# Patient Record
Sex: Male | Born: 1990 | Race: White | Hispanic: No | Marital: Single | Smoking: Light tobacco smoker
Health system: Southern US, Community
[De-identification: ages and names within clinical notes are randomized; demographics above are authoritative.]

## PROBLEM LIST (undated history)

## (undated) DIAGNOSIS — K219 Gastro-esophageal reflux disease without esophagitis: Secondary | ICD-10-CM

## (undated) DIAGNOSIS — J45909 Unspecified asthma, uncomplicated: Secondary | ICD-10-CM

## (undated) HISTORY — PX: FOOT SURGERY: SHX648

---

## 2013-09-14 ENCOUNTER — Emergency Department (HOSPITAL_COMMUNITY): Payer: BC Managed Care – PPO

## 2013-09-14 ENCOUNTER — Emergency Department (HOSPITAL_COMMUNITY)
Admission: EM | Admit: 2013-09-14 | Discharge: 2013-09-14 | Disposition: A | Discharge status: Home / Self Care | Payer: BC Managed Care – PPO | Attending: Emergency Medicine | Admitting: Emergency Medicine

## 2013-09-14 ENCOUNTER — Encounter (HOSPITAL_COMMUNITY): Payer: Self-pay | Admitting: Emergency Medicine

## 2013-09-14 DIAGNOSIS — J45909 Unspecified asthma, uncomplicated: Secondary | ICD-10-CM | POA: Insufficient documentation

## 2013-09-14 DIAGNOSIS — Y9301 Activity, walking, marching and hiking: Secondary | ICD-10-CM | POA: Insufficient documentation

## 2013-09-14 DIAGNOSIS — R609 Edema, unspecified: Secondary | ICD-10-CM | POA: Insufficient documentation

## 2013-09-14 DIAGNOSIS — W010XXA Fall on same level from slipping, tripping and stumbling without subsequent striking against object, initial encounter: Secondary | ICD-10-CM | POA: Insufficient documentation

## 2013-09-14 DIAGNOSIS — Y929 Unspecified place or not applicable: Secondary | ICD-10-CM | POA: Insufficient documentation

## 2013-09-14 DIAGNOSIS — K0889 Other specified disorders of teeth and supporting structures: Secondary | ICD-10-CM

## 2013-09-14 DIAGNOSIS — S93409A Sprain of unspecified ligament of unspecified ankle, initial encounter: Secondary | ICD-10-CM | POA: Insufficient documentation

## 2013-09-14 DIAGNOSIS — F17200 Nicotine dependence, unspecified, uncomplicated: Secondary | ICD-10-CM | POA: Insufficient documentation

## 2013-09-14 DIAGNOSIS — K089 Disorder of teeth and supporting structures, unspecified: Secondary | ICD-10-CM | POA: Insufficient documentation

## 2013-09-14 DIAGNOSIS — S93401A Sprain of unspecified ligament of right ankle, initial encounter: Secondary | ICD-10-CM

## 2013-09-14 DIAGNOSIS — R062 Wheezing: Secondary | ICD-10-CM | POA: Insufficient documentation

## 2013-09-14 HISTORY — DX: Unspecified asthma, uncomplicated: J45.909

## 2013-09-14 HISTORY — DX: Gastro-esophageal reflux disease without esophagitis: K21.9

## 2013-09-14 MED ORDER — PENICILLIN V POTASSIUM 500 MG PO TABS: 500.0000 mg | ORAL_TABLET | Freq: Three times a day (TID) | ORAL | Status: AC

## 2013-09-14 MED ORDER — OXYCODONE-ACETAMINOPHEN 5-325 MG PO TABS
2.0000 | ORAL_TABLET | Freq: Once | ORAL | Status: AC
  Administered 2013-09-14: 2 via ORAL
  Filled 2013-09-14: qty 2

## 2013-09-14 MED ORDER — OXYCODONE-ACETAMINOPHEN 5-325 MG PO TABS: 1.0000 | ORAL_TABLET | Freq: Three times a day (TID) | ORAL | Status: DC | PRN

## 2013-09-14 MED ORDER — OXYCODONE-ACETAMINOPHEN 5-325 MG PO TABS: 1.0000 | ORAL_TABLET | Freq: Three times a day (TID) | ORAL | Status: AC | PRN

## 2013-09-14 MED ORDER — PENICILLIN V POTASSIUM 500 MG PO TABS: 500.0000 mg | ORAL_TABLET | Freq: Three times a day (TID) | ORAL | Status: DC

## 2013-09-14 MED ORDER — IBUPROFEN 800 MG PO TABS: 800.0000 mg | ORAL_TABLET | Freq: Three times a day (TID) | ORAL | Status: DC

## 2013-09-14 MED ORDER — IBUPROFEN 800 MG PO TABS: 800.0000 mg | ORAL_TABLET | Freq: Three times a day (TID) | ORAL | Status: AC

## 2013-09-14 NOTE — ED Notes (Addendum)
Patient tripped on dog toy 24 hours ago.  Now with swelling and pain in right ankle.  Patient also having dental pain right lower jaw.

## 2013-09-14 NOTE — ED Notes (Signed)
Ortho tech called for splint and crutches. 

## 2013-09-14 NOTE — Discharge Instructions (Signed)
Ankle Sprain An ankle sprain is an injury to the strong, fibrous tissues (ligaments) that hold your ankle bones together.  HOME CARE   Put ice on your ankle for 1 2 days or as told by your doctor.  Put ice in a plastic bag.  Place a towel between your skin and the bag.  Leave the ice on for 15-20 minutes at a time, every 2 hours while you are awake.  Only take medicine as told by your doctor.  Raise (elevate) your injured ankle above the level of your heart as much as possible for 2 3 days.  Use crutches if your doctor tells you to. Slowly put your own weight on the affected ankle. Use the crutches until you can walk without pain.  If you have a plaster splint:  Do not rest it on anything harder than a pillow for 24 hours.  Do not put weight on it.  Do not get it wet.  Take it off to shower or bathe.  If given, use an elastic wrap or support stocking for support. Take the wrap off if your toes lose feeling (numb), tingle, or turn cold or blue.  If you have an air splint:  Add or let out air to make it comfortable.  Take it off at night and to shower and bathe.  Wiggle your toes and move your ankle up and down often while you are wearing it. GET HELP RIGHT AWAY IF:   Your toes lose feeling (numb) or turn blue.  You have severe pain that is increasing.  You have rapidly increasing bruising or puffiness (swelling).  Your toes feel very cold.  You lose feeling in your foot.  Your medicine does not help your pain. MAKE SURE YOU:   Understand these instructions.  Will watch your condition.  Will get help right away if you are not doing well or get worse. Document Released: 12/04/2007 Document Revised: 03/11/2012 Document Reviewed: 12/30/2011 [facility] Patient Information 2014 [place], [place]. Dental Pain A tooth ache may be caused by cavities (tooth decay). Cavities expose the nerve of the tooth to air and hot or cold temperatures. It may come from an infection or  abscess (also called a boil or furuncle) around your tooth. It is also often caused by dental caries (tooth decay). This causes the pain you are having. DIAGNOSIS  Your caregiver can diagnose this problem by exam. TREATMENT   If caused by an infection, it may be treated with medications which kill germs (antibiotics) and pain medications as prescribed by your caregiver. Take medications as directed.  Only take over-the-counter or prescription medicines for pain, discomfort, or fever as directed by your caregiver.  Whether the tooth ache today is caused by infection or dental disease, you should see your dentist as soon as possible for further care. SEEK MEDICAL CARE IF: The exam and treatment you received today has been provided on an emergency basis only. This is not a substitute for complete medical or dental care. If your problem worsens or new problems (symptoms) appear, and you are unable to meet with your dentist, call or return to this location. SEEK IMMEDIATE MEDICAL CARE IF:   You have a fever.  You develop redness and swelling of your face, jaw, or neck.  You are unable to open your mouth.  You have severe pain uncontrolled by pain medicine. MAKE SURE YOU:   Understand these instructions.  Will watch your condition.  Will get help right away if you  are not doing well or get worse. Document Released: 06/17/2005 Document Revised: 09/09/2011 Document Reviewed: 02/03/2008 [facility] Patient Information 2014 [place], [place].

## 2013-09-14 NOTE — ED Provider Notes (Signed)
CSN: [id number]     Arrival date & time 09/14/13  0027 History   First MD Initiated Contact with Patient 09/14/13 0057     Chief Complaint  Patient presents with  . Ankle Pain     (Consider location/radiation/quality/duration/timing/severity/associated sxs/prior Treatment) Patient is a 23 y.o. male presenting with ankle pain. The history is provided by the patient. No language interpreter was used.  Ankle Pain Location:  Ankle Time since incident:  2 days Injury: yes   Mechanism of injury: fall   Fall:    Fall occurred:  Walking   Impact surface:  Hard floor   Entrapped after fall: no   Ankle location:  R ankle Pain details:    Quality:  Aching   Radiates to:  Does not radiate   Severity:  Moderate   Onset quality:  Sudden   Timing:  Constant   Progression:  Worsening Chronicity:  New Dislocation: no   Prior injury to area:  No   Past Medical History  Diagnosis Date  . Asthma   . Acid reflux    Past Surgical History  Procedure Laterality Date  . Foot surgery     History reviewed. No pertinent family history. History  Substance Use Topics  . Smoking status: Light Tobacco Smoker  . Smokeless tobacco: Not on file  . Alcohol Use: No    Review of Systems  HENT: Positive for dental problem.   Musculoskeletal: Positive for arthralgias.  All other systems reviewed and are negative.      Allergies  Vicodin  Home Medications   Current Outpatient Rx  Name  Route  Sig  Dispense  Refill  . acetaminophen (TYLENOL) 325 MG suppository   Rectal   Place 325 mg rectally every 4 (four) hours as needed for moderate pain.         . benzocaine (ORAJEL) 10 % mucosal gel   Mouth/Throat   Use as directed 1 application in the mouth or throat as needed for mouth pain.         Marland Kitchen ibuprofen (ADVIL,MOTRIN) 800 MG tablet   Oral   Take 1 tablet (800 mg total) by mouth 3 (three) times daily.   21 tablet   0   . oxyCODONE-acetaminophen (PERCOCET/ROXICET) 5-325 MG per  tablet   Oral   Take 1 tablet by mouth every 8 (eight) hours as needed for severe pain.   12 tablet   0   . penicillin v potassium (VEETID) 500 MG tablet   Oral   Take 1 tablet (500 mg total) by mouth 3 (three) times daily.   30 tablet   0    BP 154/78  Pulse 95  Temp(Src) 98.1 F (36.7 C) (Oral)  Resp 17  SpO2 99% Physical Exam  Nursing note and vitals reviewed. Constitutional: He is oriented to person, place, and time. He appears well-developed and well-nourished.  HENT:  Mouth/Throat:    Eyes: Pupils are equal, round, and reactive to light.  Neck: Normal range of motion.  Cardiovascular: Normal rate.   Pulmonary/Chest: Effort normal. He has wheezes.  Abdominal: Soft. Bowel sounds are normal.  Musculoskeletal: He exhibits edema and tenderness.  Lymphadenopathy:    He has no cervical adenopathy.  Neurological: He is alert and oriented to person, place, and time.  Skin: Skin is warm and dry.  Psychiatric: He has a normal mood and affect. His behavior is normal. Judgment and thought content normal.    ED Course  Procedures (including critical care  time) Labs Review Labs Reviewed - No data to display Imaging Review Dg Ankle Complete Right  09/14/2013   CLINICAL DATA:  Fall, pain  EXAM: RIGHT ANKLE - COMPLETE 3+ VIEW  COMPARISON:  None.  FINDINGS: There is no evidence of fracture, dislocation, or joint effusion. There is no evidence of arthropathy or other focal bone abnormality. Soft tissue swelling present at the lateral malleolus.  IMPRESSION: Mild soft tissue swelling at the lateral malleolus. No acute fracture or dislocation.   Electronically Signed   By: Rise MuBenjamin  McClintock M.D.   On: 09/14/2013 01:31   Dg Foot Complete Right  09/14/2013   CLINICAL DATA:  Status post fall 1 day ago. Right ankle and foot pain.  EXAM: RIGHT FOOT COMPLETE - 3+ VIEW  COMPARISON:  None.  FINDINGS: Soft tissues of the foot appear diffusely swollen. No acute bony or joint abnormality is  identified.  IMPRESSION: Soft tissue swelling without underlying acute bony or joint abnormality.   Electronically Signed   By: Drusilla Kannerhomas  Dalessio M.D.   On: 09/14/2013 01:32     EKG Interpretation None     Patient presents today for evaluation of right ankle pain after fall at home two days ago.  Ankle swollen, tender along medial malleolus. No fracture or dislocation on xray. Splint, crutches, ortho follow-up. Patient also reports dental pain.  Right back molar noted to be fractured.  Patient does not have a dentist. Antibiotics, anti-inflammatory, dental follow-up.    MDM   Final diagnoses:  Right ankle sprain  Pain, dental        Jimmye Normanavid John Varun Jourdan, NP 09/14/13 0230

## 2013-09-14 NOTE — ED Provider Notes (Signed)
Medical screening examination/treatment/procedure(s) were performed by non-physician practitioner and as supervising physician I was immediately available for consultation/collaboration.     Xaniyah Buchholz, MD 09/14/13 2335 

## 2014-07-31 ENCOUNTER — Encounter (HOSPITAL_COMMUNITY): Payer: Self-pay | Admitting: Emergency Medicine

## 2014-07-31 ENCOUNTER — Emergency Department (HOSPITAL_COMMUNITY)
Admission: EM | Admit: 2014-07-31 | Discharge: 2014-07-31 | Disposition: A | Discharge status: Home / Self Care | Payer: Self-pay | Attending: Emergency Medicine | Admitting: Emergency Medicine

## 2014-07-31 DIAGNOSIS — Z720 Tobacco use: Secondary | ICD-10-CM | POA: Insufficient documentation

## 2014-07-31 DIAGNOSIS — Z79899 Other long term (current) drug therapy: Secondary | ICD-10-CM | POA: Insufficient documentation

## 2014-07-31 DIAGNOSIS — F1123 Opioid dependence with withdrawal: Secondary | ICD-10-CM | POA: Insufficient documentation

## 2014-07-31 DIAGNOSIS — J45901 Unspecified asthma with (acute) exacerbation: Secondary | ICD-10-CM | POA: Insufficient documentation

## 2014-07-31 DIAGNOSIS — Z8719 Personal history of other diseases of the digestive system: Secondary | ICD-10-CM | POA: Insufficient documentation

## 2014-07-31 DIAGNOSIS — F1193 Opioid use, unspecified with withdrawal: Secondary | ICD-10-CM

## 2014-07-31 DIAGNOSIS — F1120 Opioid dependence, uncomplicated: Secondary | ICD-10-CM

## 2014-07-31 DIAGNOSIS — Z791 Long term (current) use of non-steroidal anti-inflammatories (NSAID): Secondary | ICD-10-CM | POA: Insufficient documentation

## 2014-07-31 MED ORDER — DICYCLOMINE HCL 20 MG PO TABS
20.0000 mg | ORAL_TABLET | Freq: Once | ORAL | Status: AC
  Administered 2014-07-31: 20 mg via ORAL
  Filled 2014-07-31: qty 1

## 2014-07-31 MED ORDER — LOPERAMIDE HCL 2 MG PO CAPS: 2.0000 mg | ORAL_CAPSULE | Freq: Once | ORAL | Status: AC

## 2014-07-31 MED ORDER — DICYCLOMINE HCL 20 MG PO TABS: 20.0000 mg | ORAL_TABLET | Freq: Four times a day (QID) | ORAL | Status: AC

## 2014-07-31 MED ORDER — CYCLOBENZAPRINE HCL 10 MG PO TABS: 10.0000 mg | ORAL_TABLET | Freq: Three times a day (TID) | ORAL | Status: AC | PRN

## 2014-07-31 MED ORDER — NAPROXEN 500 MG PO TABS
500.0000 mg | ORAL_TABLET | Freq: Once | ORAL | Status: AC
  Administered 2014-07-31: 500 mg via ORAL
  Filled 2014-07-31: qty 1

## 2014-07-31 MED ORDER — HYDROXYZINE HCL 25 MG PO TABS
25.0000 mg | ORAL_TABLET | Freq: Once | ORAL | Status: AC
  Administered 2014-07-31: 25 mg via ORAL
  Filled 2014-07-31: qty 1

## 2014-07-31 MED ORDER — METHOCARBAMOL 500 MG PO TABS
500.0000 mg | ORAL_TABLET | Freq: Once | ORAL | Status: AC
  Administered 2014-07-31: 500 mg via ORAL
  Filled 2014-07-31: qty 1

## 2014-07-31 MED ORDER — ONDANSETRON 4 MG PO TBDP
4.0000 mg | ORAL_TABLET | Freq: Once | ORAL | Status: AC
  Administered 2014-07-31: 4 mg via ORAL
  Filled 2014-07-31: qty 1

## 2014-07-31 MED ORDER — NAPROXEN 500 MG PO TABS: 500.0000 mg | ORAL_TABLET | Freq: Two times a day (BID) | ORAL | Status: AC

## 2014-07-31 MED ORDER — LOPERAMIDE HCL 2 MG PO CAPS
2.0000 mg | ORAL_CAPSULE | Freq: Once | ORAL | Status: AC
  Administered 2014-07-31: 2 mg via ORAL
  Filled 2014-07-31: qty 1

## 2014-07-31 MED ORDER — CLONIDINE HCL 0.1 MG PO TABS: ORAL_TABLET | ORAL | Status: AC

## 2014-07-31 MED ORDER — DIPHENHYDRAMINE HCL 25 MG PO TABS: 25.0000 mg | ORAL_TABLET | Freq: Four times a day (QID) | ORAL | Status: AC | PRN

## 2014-07-31 MED ORDER — ONDANSETRON HCL 4 MG PO TABS: 4.0000 mg | ORAL_TABLET | Freq: Three times a day (TID) | ORAL | Status: AC | PRN

## 2014-07-31 NOTE — ED Provider Notes (Signed)
CSN: [id number]     Arrival date & time 07/31/14  0256 History   First MD Initiated Contact with Patient 07/31/14 [phone]     Chief Complaint  Patient presents with  . Medical Clearance     (Consider location/radiation/quality/duration/timing/severity/associated sxs/prior Treatment) HPI  Troy Swanson This 24 year old male who presents emergency Department seeking detox from heroin. He states he has been using for about the last 3 months with his girlfriend. He denies a history of previous heroin abuse. He does have a history of narcotic pain pill abuse. He denies any other drug abuse.  He endorses cold sweats, abdominal cramping and nausea, but has not yet had vomiting or diarrhea. He denies any other medical problems at this time. Patient denies suicidal ideation, homicidal ideation or audiovisual hallucinations.   Past Medical History  Diagnosis Date  . Asthma   . Acid reflux    Past Surgical History  Procedure Laterality Date  . Foot surgery     History reviewed. No pertinent family history. History  Substance Use Topics  . Smoking status: Light Tobacco Smoker    Types: Cigarettes  . Smokeless tobacco: Never Used  . Alcohol Use: No    Review of Systems  Ten systems reviewed and are negative for acute change, except as noted in the HPI.    Allergies  Vicodin  Home Medications   Prior to Admission medications   Medication Sig Start Date End Date Taking? Authorizing Provider  naproxen sodium (ANAPROX) 220 MG tablet Take 440 mg by mouth 2 (two) times daily as needed (pain).   Yes Historical Provider, MD  cloNIDine (CATAPRES) 0.1 MG tablet Take one tablet every 3 hours (8 doses) on day ONE. Take one tablet every 6 hours (4 doses) on day TWO. Take one tablet every 12 hours (2 doses) on day THREE. 07/31/14   Arthor Captain, PA-C  cyclobenzaprine (FLEXERIL) 10 MG tablet Take 1 tablet (10 mg total) by mouth 3 (three) times daily as needed for muscle spasms. 07/31/14    Arthor Captain, PA-C  dicyclomine (BENTYL) 20 MG tablet Take 1 tablet (20 mg total) by mouth every 6 (six) hours. For abdominal cramping 07/31/14   Arthor Captain, PA-C  diphenhydrAMINE (BENADRYL) 25 MG tablet Take 1 tablet (25 mg total) by mouth every 6 (six) hours as needed. 07/31/14   Arthor Captain, PA-C  ibuprofen (ADVIL,MOTRIN) 800 MG tablet Take 1 tablet (800 mg total) by mouth 3 (three) times daily. Patient not taking: Reported on 07/31/2014 09/14/13   Jimmye Norman, NP  loperamide (IMODIUM) 2 MG capsule Take 1-2 capsules (2-4 mg total) by mouth once. FOR DIARRHEA 07/31/14   Arthor Captain, PA-C  naproxen (NAPROSYN) 500 MG tablet Take 1 tablet (500 mg total) by mouth 2 (two) times daily. FOR BODY ACHES 07/31/14   Arthor Captain, PA-C  ondansetron (ZOFRAN) 4 MG tablet Take 1 tablet (4 mg total) by mouth every 8 (eight) hours as needed for nausea or vomiting. 07/31/14   Arthor Captain, PA-C  oxyCODONE-acetaminophen (PERCOCET/ROXICET) 5-325 MG per tablet Take 1 tablet by mouth every 8 (eight) hours as needed for severe pain. Patient not taking: Reported on 07/31/2014 09/14/13   Jimmye Norman, NP  penicillin v potassium (VEETID) 500 MG tablet Take 1 tablet (500 mg total) by mouth 3 (three) times daily. Patient not taking: Reported on 07/31/2014 09/14/13   Jimmye Norman, NP   BP 122/69 mmHg  Pulse 91  Temp(Src) 98 F (36.7 C) (Oral)  Resp 16  Ht 6' 2.5" (1.892 m)  Wt 235 lb (106.595 kg)  BMI 29.78 kg/m2  SpO2 99% Physical Exam  Constitutional: He appears well-developed and well-nourished. No distress.  HENT:  Head: Normocephalic and atraumatic.  Eyes: Conjunctivae are normal. No scleral icterus.  Neck: Normal range of motion. Neck supple.  Cardiovascular: Normal rate, regular rhythm and normal heart sounds.   Pulmonary/Chest: Effort normal. No respiratory distress. He has wheezes (mild expiratory wheeze).  Abdominal: Soft. There is no tenderness.  Musculoskeletal: He exhibits no  edema.  Neurological: He is alert.  Skin: Skin is warm and dry. He is not diaphoretic.  Psychiatric: His behavior is normal.  Nursing note and vitals reviewed.   ED Course  Procedures (including critical care time) Labs Review Labs Reviewed - No data to display  Imaging Review No results found.   EKG Interpretation None      MDM   Final diagnoses:  Heroin dependence  Heroin withdrawal    8:22 AM BP 122/69 mmHg  Pulse 91  Temp(Src) 98 F (36.7 C) (Oral)  Resp 16  Ht 6' 2.5" (1.892 m)  Wt 235 lb (106.595 kg)  BMI 29.78 kg/m2  SpO2 99% The patient declines treatment for asthma today. Oxygen saturations are 99% on room air. He states he has an inhaler at home and will use it. Patient will be treated here for his symptoms and discharged with symptomatic treatment for withdrawal. Patient will be given outpatient resources. He appears safe for discharge.    Arthor CaptainAbigail Charlott Calvario, PA-C 07/31/14 [id number]  Tomasita CrumbleAdeleke Oni, MD 07/31/14 2036

## 2014-07-31 NOTE — Discharge Instructions (Signed)
Opioid Use Disorder °Opioid use disorder is a mental disorder. It is the continued nonmedical use of opioids in spite of risks to health and well-being. Misused opioids include the street drug heroin. They also include pain medicines such as morphine, hydrocodone, oxycodone, and fentanyl. Opioids are very addictive. People who misuse opioids get an exaggerated feeling of well-being. Opioid use disorder often disrupts activities at home, work, or school. It may cause mental or physical problems.  °A family history of opioid use disorder puts you at higher risk of it. People with opioid use disorder often misuse other drugs or have mental illness such as depression, posttraumatic stress disorder, or antisocial personality disorder. They also are at risk of suicide and death from overdose. °SIGNS AND SYMPTOMS  °Signs and symptoms of opioid use disorder include: °· Use of opioids in larger amounts or over a longer period than intended. °· Unsuccessful attempts to cut down or control opioid use. °· A lot of time spent obtaining, using, or recovering from the effects of opioids. °· A strong desire or urge to use opioids (craving). °· Continued use of opioids in spite of major problems at work, school, or home because of use. °· Continued use of opioids in spite of relationship problems because of use. °· Giving up or cutting down on important life activities because of opioid use. °· Use of opioids over and over in situations when it is physically hazardous, such as driving a car. °· Continued use of opioids in spite of a physical problem that is likely related to use. Physical problems can include: °· Severe constipation. °· Poor nutrition. °· Infertility. °· Tuberculosis. °· Aspiration pneumonia. °· Infections such as human immunodeficiency virus (HIV) and hepatitis (from injecting opioids). °· Continued use of opioids in spite of a mental problem that is likely related to use. Mental problems can  include: °· Depression. °· Anxiety. °· Hallucinations. °· Sleep problems. °· Loss of sexual function. °· Need to use more and more opioids to get the same effect, or lessened effect over time with use of the same amount (tolerance). °· Having withdrawal symptoms when opioid use is stopped, or using opioids to reduce or avoid withdrawal symptoms. Withdrawal symptoms include: °· Depressed, anxious, or irritable mood. °· Nausea, vomiting, diarrhea, or intestinal cramping. °· Muscle aches or spasms. °· Excessive tearing or runny nose. °· Dilated pupils, sweating, or hairs standing on end. °· Yawning. °· Fever, raised blood pressure, or fast pulse. °· Restlessness or trouble sleeping. This does not apply to people taking opioids for medical reasons only. °DIAGNOSIS °Opioid use disorder is diagnosed by your health care provider. You may be asked questions about your opioid use and and how it affects your life. A physical exam may be done. A drug screen may be ordered. You may be referred to a mental health professional. The diagnosis of opioid use disorder requires at least two symptoms within 12 months. The type of opioid use disorder you have depends on the number of signs and symptoms you have. The type may be: °· Mild. Two or three signs and symptoms.    °· Moderate. Four or five signs and symptoms.   °· Severe. Six or more signs and symptoms. °TREATMENT  °Treatment is usually provided by mental health professionals with training in substance use disorders. The following options are available: °· Detoxification. This is the first step in treatment for withdrawal. It is medically supervised withdrawal with the use of medicines. These medicines lessen withdrawal symptoms. They also raise the chance   of becoming opioid free. °· Counseling, also known as talk therapy. Talk therapy addresses the reasons you use opioids. It also addresses ways to keep you from using again (relapse). The goals of talk therapy are to avoid  relapse by: °· Identifying and avoiding triggers for use. °· Finding healthy ways to cope with stress. °· Learning how to handle cravings. °· Support groups. Support groups provide emotional support, advice, and guidance. °· A medicine that blocks opioid receptors in your brain. This medicine can reduce opioid cravings that lead to relapse. This medicine also blocks the desired opioid effect when relapse occurs. °· Opioids that are taken by mouth in place of the misused opioid (opioid maintenance treatment). These medicines satisfy cravings but are safer than commonly misused opioids. This often is the best option for people who continue to relapse with other treatments. °HOME CARE INSTRUCTIONS  °· Take medicines only as directed by your health care provider. °· Check with your health care provider before starting new medicines. °· Keep all follow-up visits as directed by your health care provider. °SEEK MEDICAL CARE IF: °· You are not able to take your medicines as directed. °· Your symptoms get worse. °SEEK IMMEDIATE MEDICAL CARE IF: °· You have serious thoughts about hurting yourself or others. °· You may have taken an overdose of opioids. °FOR MORE INFORMATION °· National Institute on Drug Abuse: www.drugabuse.gov °· Substance Abuse and Mental Health Services Administration: www.samhsa.gov °Document Released: 04/14/2007 Document Revised: 11/01/2013 Document Reviewed: 06/30/2013 °Exit[employer]® Patient Information ©2015 [employer]. This information is not intended to replace advice given to you by your health care provider. Make sure you discuss any questions you have with your health care provider. ° °Opioid Withdrawal °Opioids are a group of narcotic drugs. They include the street drug heroin. They also include pain medicines, such as morphine, hydrocodone, oxycodone, and fentanyl. Opioid withdrawal is a group of characteristic physical and mental signs and symptoms. It typically occurs if you have been using  opioids daily for several weeks or longer and stop using or rapidly decrease use. Opioid withdrawal can also occur if you have used opioids daily for a long time and are given a medicine to block the effect.  °SIGNS AND SYMPTOMS °Opioid withdrawal includes three or more of the following symptoms:  °· Depressed, anxious, or irritable mood. °· Nausea or vomiting. °· Muscle aches or spasms.   °· Watery eyes.    °· Runny nose. °· Dilated pupils, sweating, or hairs standing on end. °· Diarrhea or intestinal cramping. °· Yawning.   °· Fever. °· Increased blood pressure. °· Fast pulse. °· Restlessness or trouble sleeping. °These signs and symptoms occur within several hours of stopping or reducing short-acting opioids, such as heroin. They can occur within 3 days of stopping or reducing long-acting opioids, such as methadone. Withdrawal begins within minutes of receiving a drug that blocks the effects of opioids, such as naltrexone or naloxone. °DIAGNOSIS  °Opioid use disorder is diagnosed by your health care provider. You will be asked about your symptoms, drug and alcohol use, medical history, and use of medicines. A physical exam may be done. Lab tests may be ordered. Your health care provider may have you see a mental health professional.  °TREATMENT  °The treatment for opioid withdrawal is usually provided by medical doctors with special training in substance use disorders (addiction specialists). The following medicines may be included in treatment: °· Opioids given in place of the abused opioid. They turn on opioid receptors in the brain and   lessen or prevent withdrawal symptoms. They are gradually decreased (opioid substitution and taper).  Non-opioids that can lessen certain opioid withdrawal symptoms. They may be used alone or with opioid substitution and taper. Successful long-term recovery usually requires medicine, counseling, and group support. HOME CARE INSTRUCTIONS   Take medicines only as directed by  your health care provider.  Check with your health care provider before starting new medicines.  Keep all follow-up visits as directed by your health care provider. SEEK MEDICAL CARE IF:  You are not able to take your medicines as directed.  Your symptoms get worse.  You relapse. SEEK IMMEDIATE MEDICAL CARE IF:  You have serious thoughts about hurting yourself or others.  You have a seizure.  You lose consciousness. Document Released: 06/20/2003 Document Revised: 11/01/2013 Document Reviewed: 06/30/2013 [facility] Patient Information 2015 [place], [place]. This information is not intended to replace advice given to you by your health care provider. Make sure you discuss any questions you have with your health care provider.  Narcotic Overdose A narcotic overdose is the misuse or overuse of a narcotic drug. A narcotic overdose can make you pass out and stop breathing. If you are not treated right away, this can cause permanent brain damage or stop your heart. Medicine may be given to reverse the effects of an overdose. If so, this medicine may bring on withdrawal symptoms. The symptoms may be abdominal cramps, throwing up (vomiting), sweating, chills, and nervousness. Injecting narcotics can cause more problems than just an overdose. AIDS, hepatitis, and other very serious infections are transmitted by sharing needles and syringes. If you decide to quit using, there are medicines which can help you through the withdrawal period. Trying to quit all at once on your own can be uncomfortable, but not life-threatening. Call your caregiver, Narcotics Anonymous, or any drug and alcohol treatment program for further help.  Document Released: 07/25/2004 Document Revised: 09/09/2011 Document Reviewed: 05/19/2009 [facility] Patient Information 2015 [place], [place]. This information is not intended to replace advice given to you by your health care provider. Make sure you discuss any questions you have with  your health care provider.

## 2014-07-31 NOTE — ED Notes (Signed)
Pt reports that he wants to stop the withdrawal symptoms from stopping to use heroin. States last use was this time last night. Reports generalized body cramps, and abdominal cramping, sweating, n/v/d. Denies ETOH use or any other drug use. Reports generally using 1.5 g of heroin a day.

## 2014-11-29 IMAGING — CR DG ANKLE COMPLETE 3+V*R*
3 series · 3 of 3 positions shown · non-contrast
Comparison: None.

CLINICAL DATA: Fall, pain

EXAM:
RIGHT ANKLE - COMPLETE 3+ VIEW

[x ankle ap right]
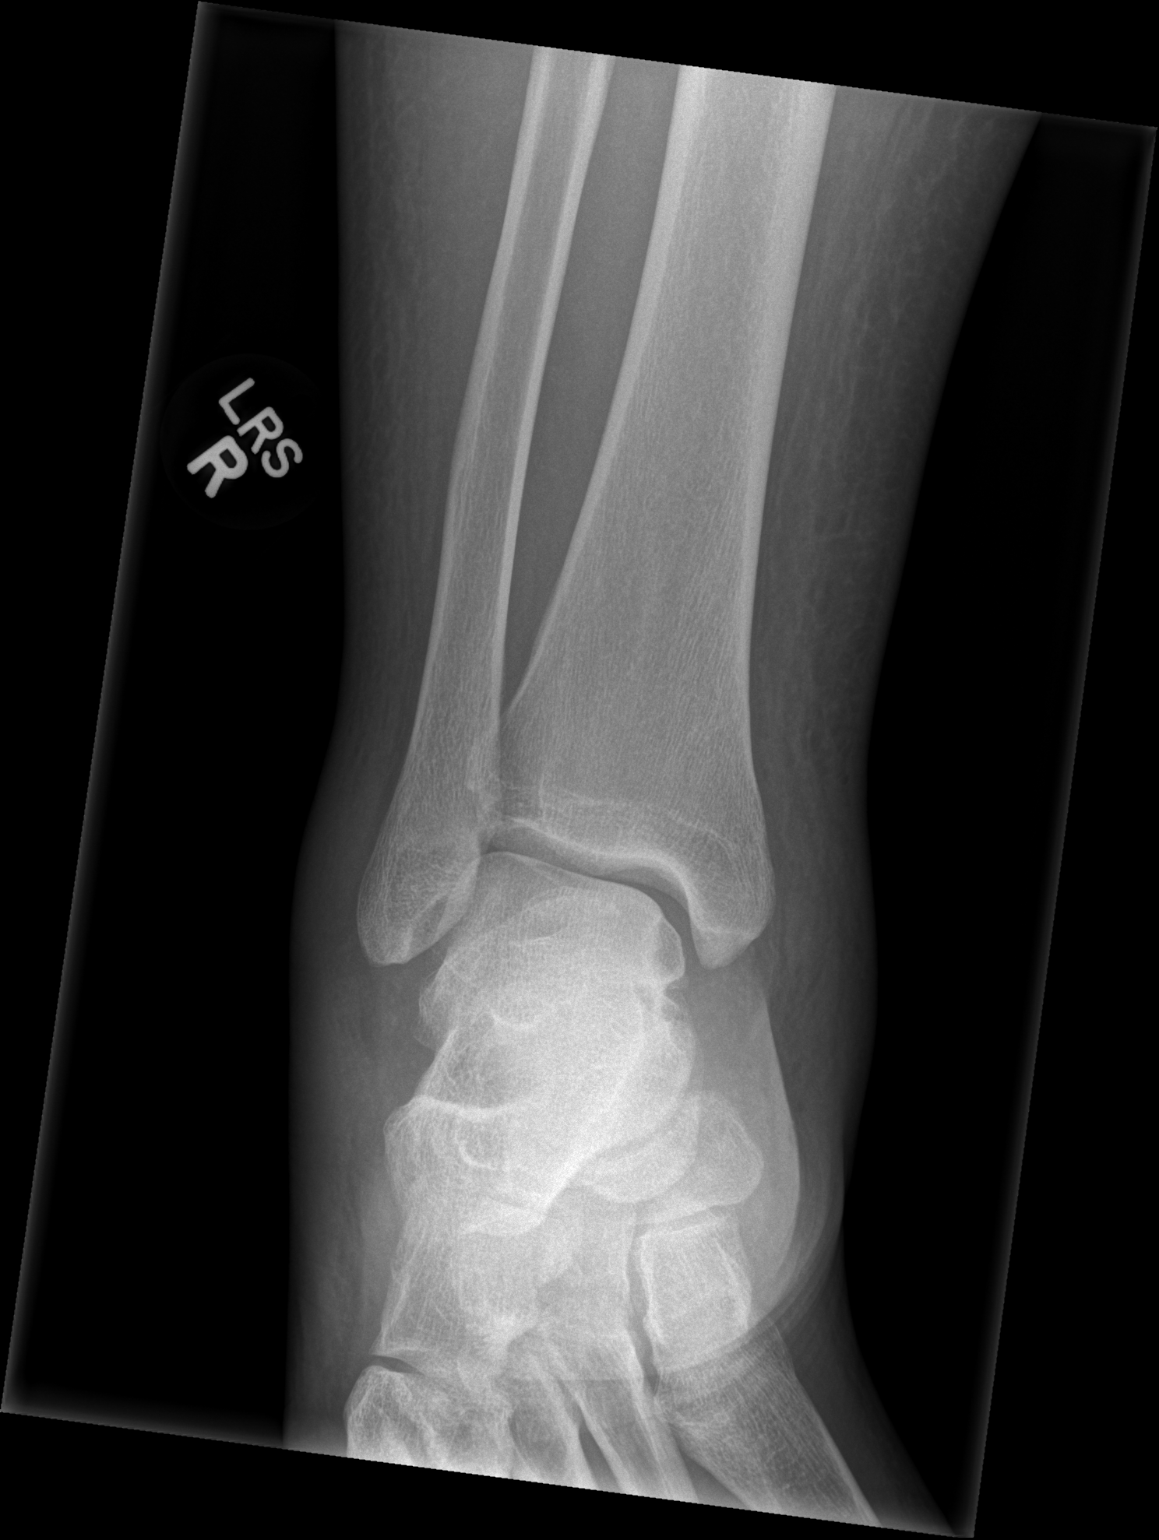

[x ankle obl right]
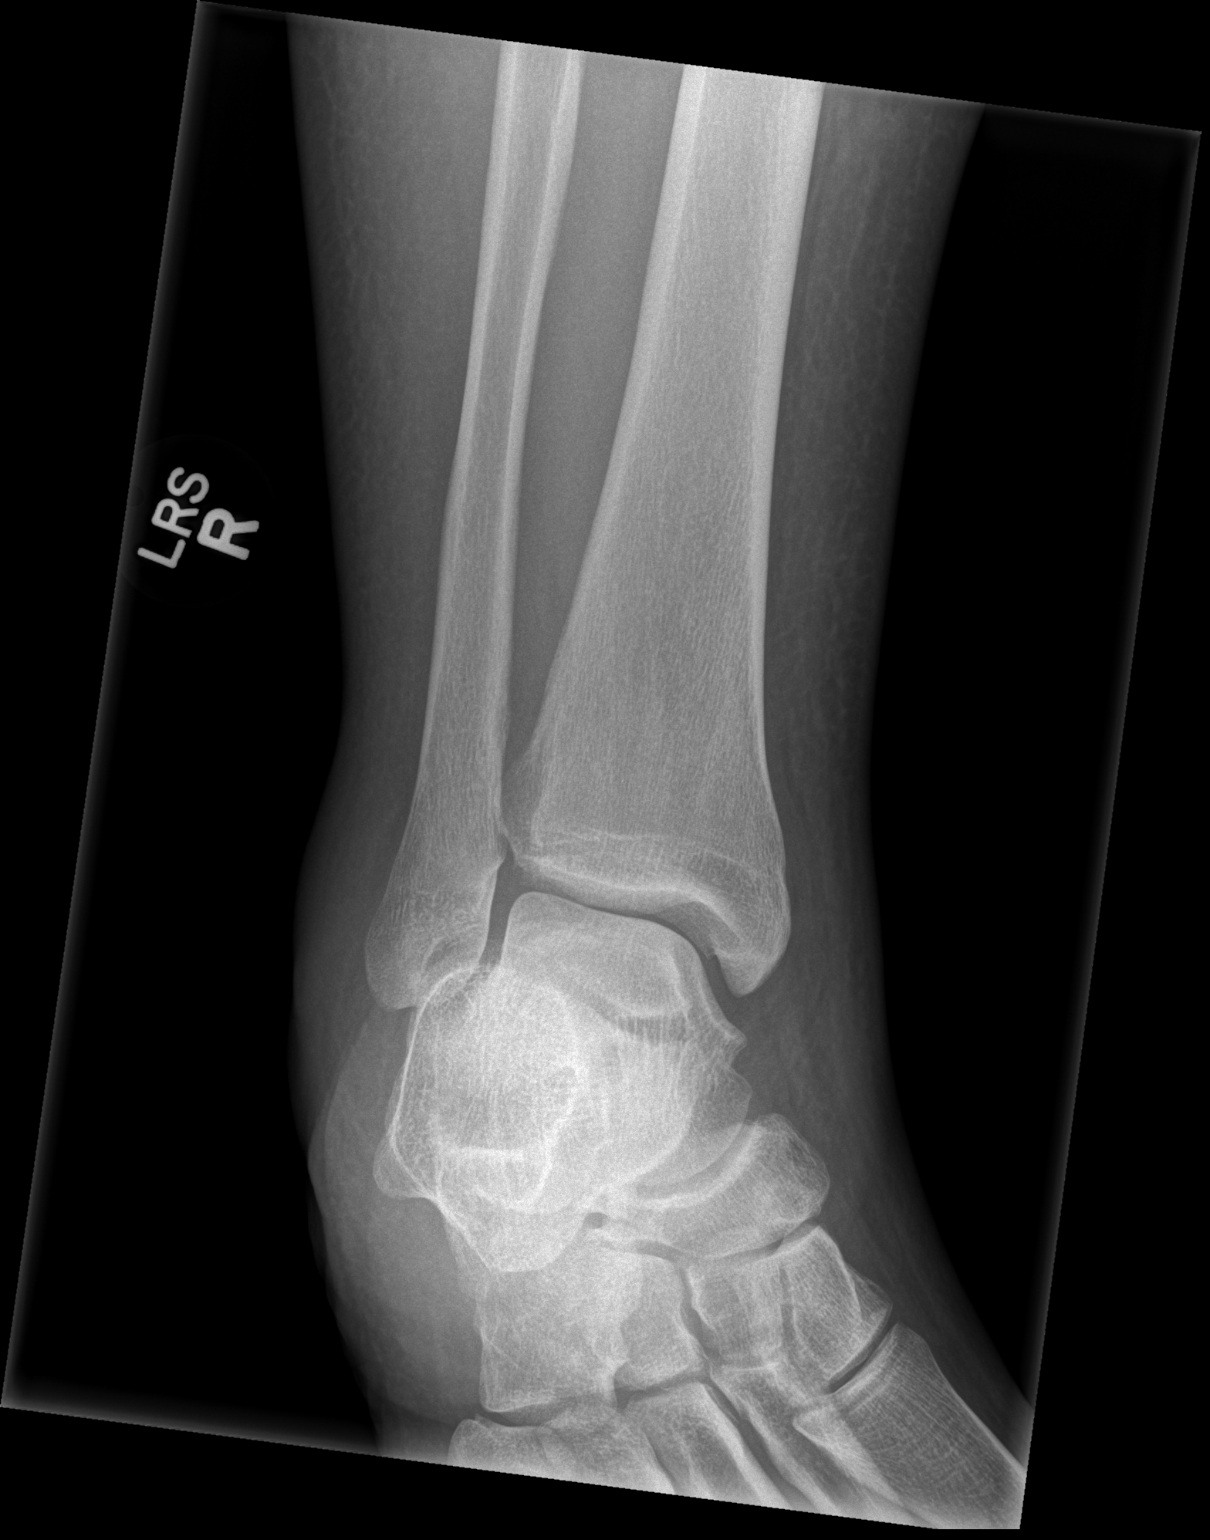

[x ankle lat right]
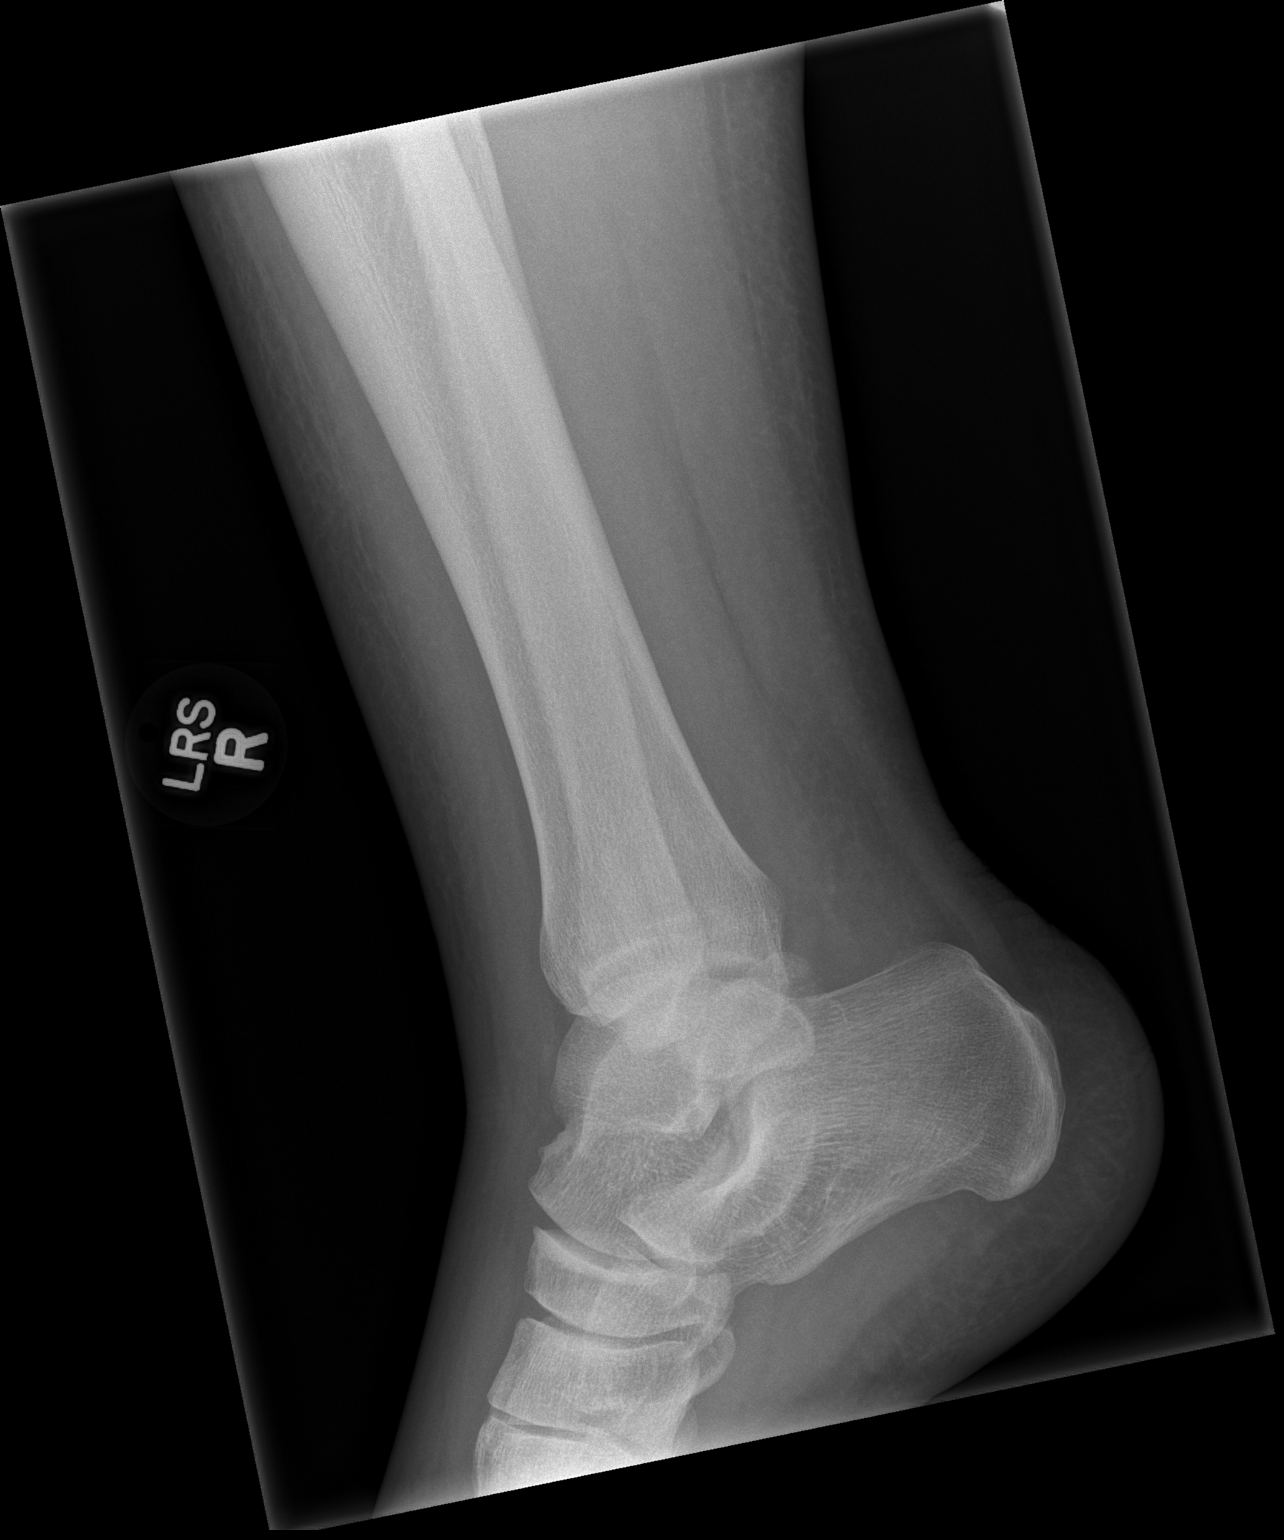

[3 of 3 positions shown; findings below may reference images not displayed]

FINDINGS: There is no evidence of fracture, dislocation, or joint effusion.
There is no evidence of arthropathy or other focal bone abnormality.
Soft tissue swelling present at the lateral malleolus.
IMPRESSION: Mild soft tissue swelling at the lateral malleolus. No acute
fracture or dislocation.

## 2014-11-29 IMAGING — CR DG FOOT COMPLETE 3+V*R*
3 series · 3 of 3 positions shown · non-contrast
Comparison: None.

CLINICAL DATA: Status post fall 1 day ago. Right ankle and foot
pain.

EXAM:
RIGHT FOOT COMPLETE - 3+ VIEW

[x foot lat right]
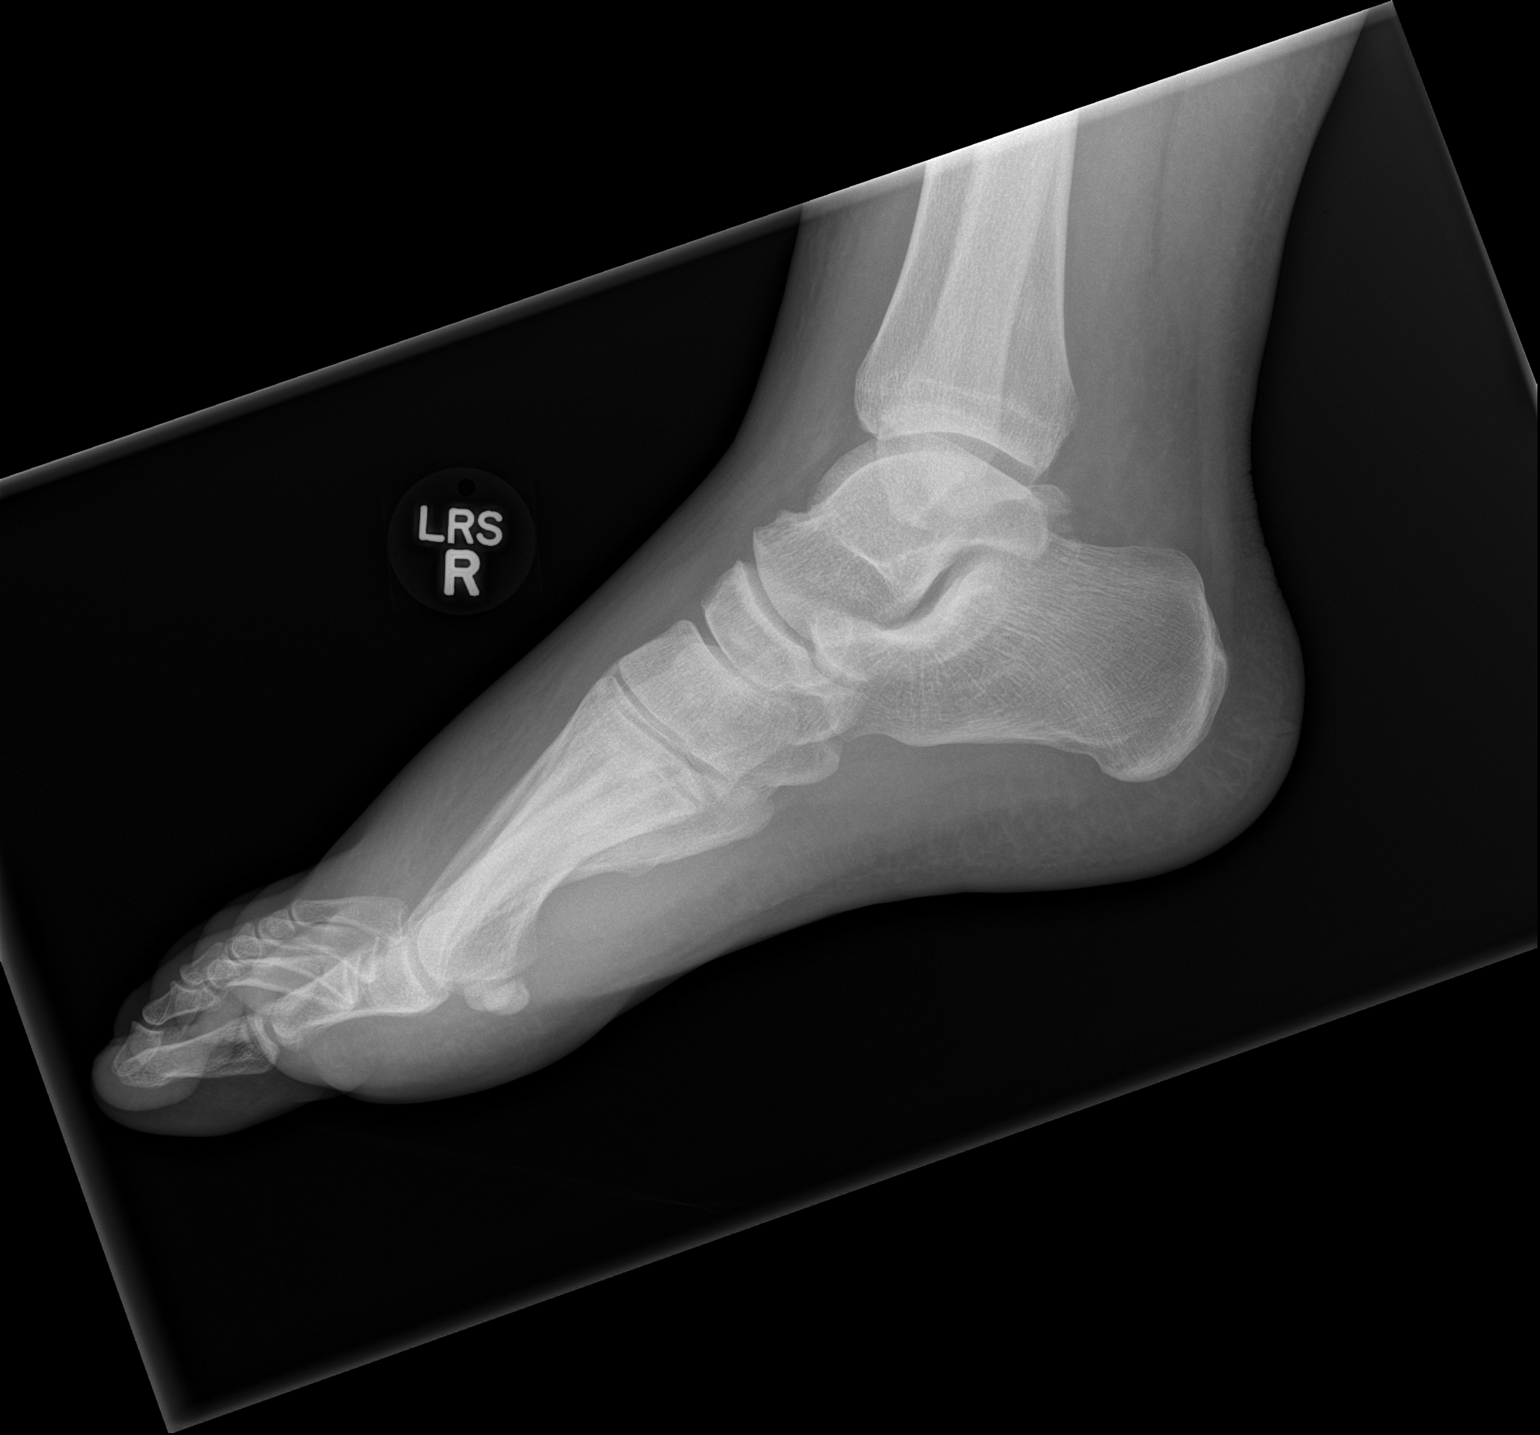

[x foot ap right]
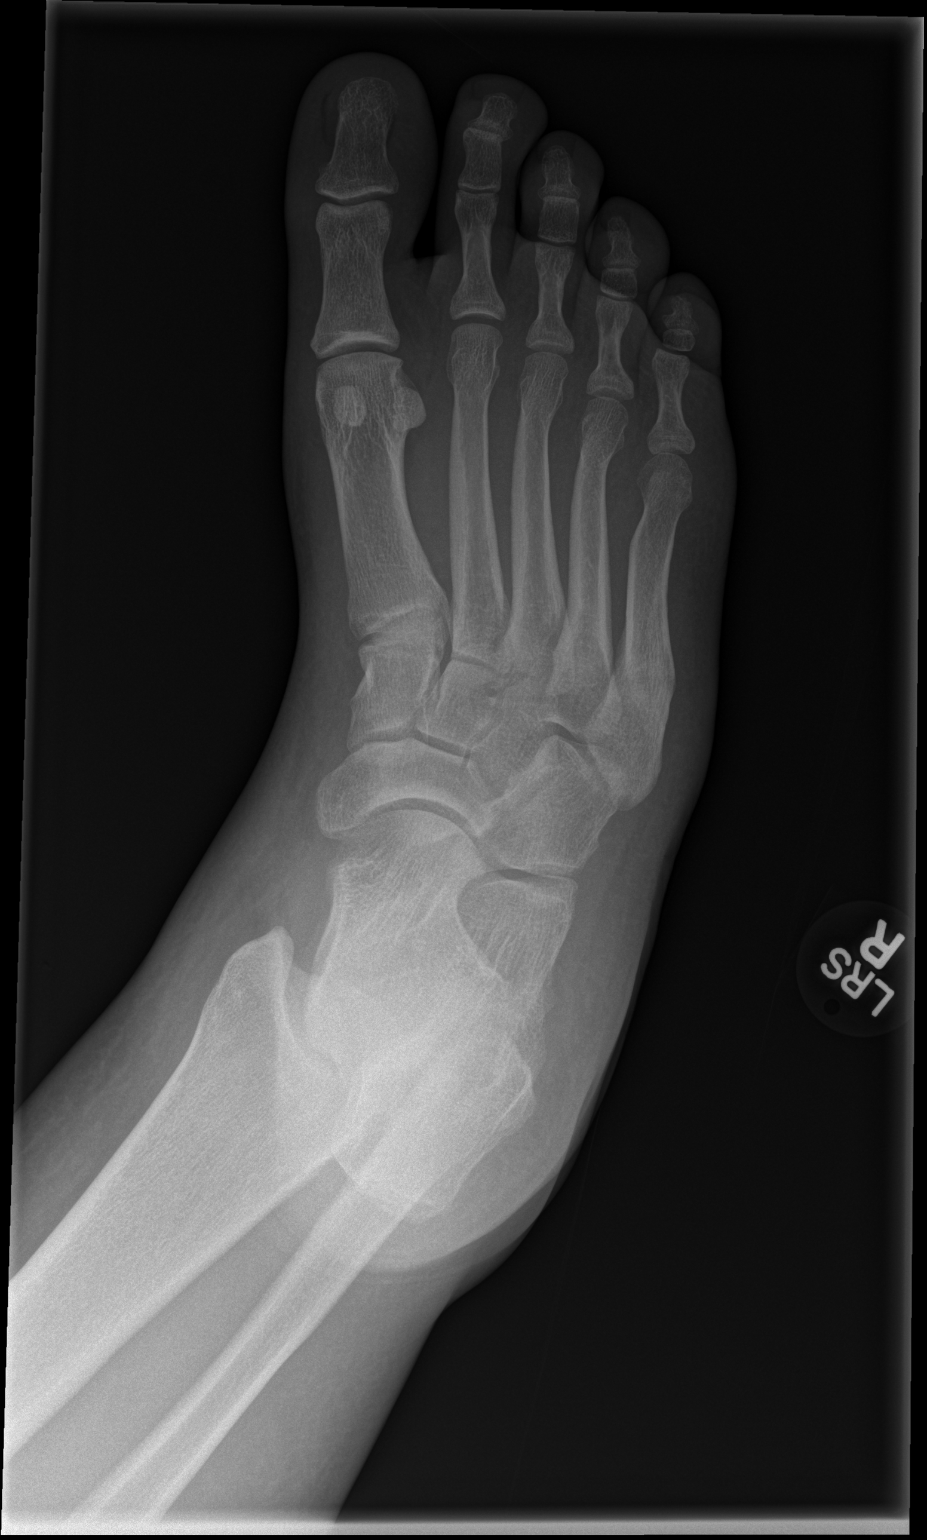

[x foot obl right]
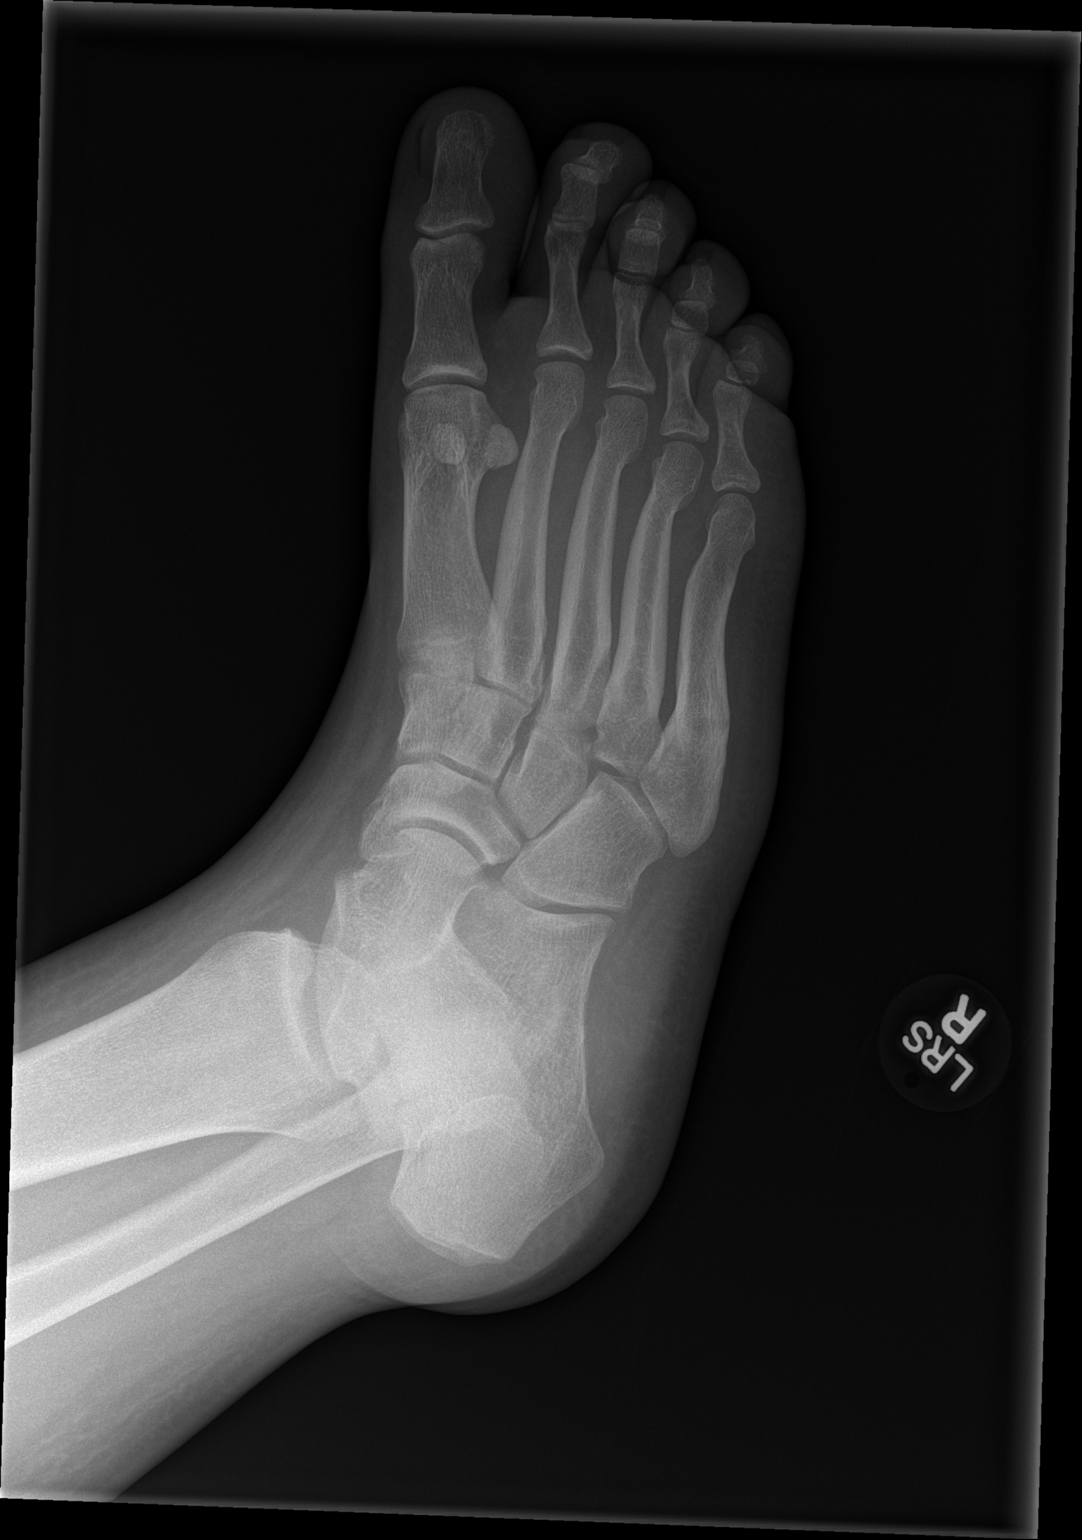

[3 of 3 positions shown; findings below may reference images not displayed]

FINDINGS: Soft tissues of the foot appear diffusely swollen. No acute bony or
joint abnormality is identified.
IMPRESSION: Soft tissue swelling without underlying acute bony or joint
abnormality.

## 2015-08-02 ENCOUNTER — Encounter (HOSPITAL_COMMUNITY): Payer: Self-pay

## 2015-08-02 ENCOUNTER — Emergency Department (HOSPITAL_COMMUNITY)
Admission: EM | Admit: 2015-08-02 | Discharge: 2015-08-02 | Disposition: A | Discharge status: Home / Self Care | Payer: Self-pay | Attending: Physician Assistant | Admitting: Physician Assistant

## 2015-08-02 DIAGNOSIS — F17210 Nicotine dependence, cigarettes, uncomplicated: Secondary | ICD-10-CM | POA: Insufficient documentation

## 2015-08-02 DIAGNOSIS — Z8719 Personal history of other diseases of the digestive system: Secondary | ICD-10-CM | POA: Insufficient documentation

## 2015-08-02 DIAGNOSIS — Y9389 Activity, other specified: Secondary | ICD-10-CM | POA: Insufficient documentation

## 2015-08-02 DIAGNOSIS — S61212A Laceration without foreign body of right middle finger without damage to nail, initial encounter: Secondary | ICD-10-CM | POA: Insufficient documentation

## 2015-08-02 DIAGNOSIS — Z791 Long term (current) use of non-steroidal anti-inflammatories (NSAID): Secondary | ICD-10-CM | POA: Insufficient documentation

## 2015-08-02 DIAGNOSIS — J45909 Unspecified asthma, uncomplicated: Secondary | ICD-10-CM | POA: Insufficient documentation

## 2015-08-02 DIAGNOSIS — Y9289 Other specified places as the place of occurrence of the external cause: Secondary | ICD-10-CM | POA: Insufficient documentation

## 2015-08-02 DIAGNOSIS — Z79899 Other long term (current) drug therapy: Secondary | ICD-10-CM | POA: Insufficient documentation

## 2015-08-02 DIAGNOSIS — Y998 Other external cause status: Secondary | ICD-10-CM | POA: Insufficient documentation

## 2015-08-02 DIAGNOSIS — W260XXA Contact with knife, initial encounter: Secondary | ICD-10-CM | POA: Insufficient documentation

## 2015-08-02 MED ORDER — IBUPROFEN 800 MG PO TABS
800.0000 mg | ORAL_TABLET | Freq: Once | ORAL | Status: AC
  Administered 2015-08-02: 800 mg via ORAL
  Filled 2015-08-02: qty 1

## 2015-08-02 NOTE — ED Notes (Signed)
Pt c/o R anterior middle and ring finger lacerations after cutting it w/ a razor blade this morning.  Pain score 8/10.  Pt reports that he was trying to cut a cable when the blade slipped.  Full ROM noted.

## 2015-08-02 NOTE — Discharge Instructions (Signed)
Tissue Adhesive Wound Care  ° °Some cuts, wounds, lacerations, and incisions can be repaired by using tissue adhesive. Tissue adhesive is like glue. It holds the skin together, allowing for faster healing. It forms a strong bond on the skin in about 1 minute and reaches its full strength in about 2 or 3 minutes. The adhesive disappears naturally while the wound is healing. It is important to take proper care of your wound at home while it heals.  °HOME CARE INSTRUCTIONS  °Showers are allowed. Do not soak the area containing the tissue adhesive. Do not take baths, swim, or use hot tubs. Do not use any soaps or ointments on the wound. Certain ointments can weaken the glue.  °If a bandage (dressing) has been applied, follow your health care provider's instructions for how often to change the dressing.  °Keep the dressing dry if one has been applied.  °Do not scratch, pick, or rub the adhesive.  °Do not place tape over the adhesive. The adhesive could come off when pulling the tape off.  °Protect the wound from further injury until it is healed.  °Protect the wound from sun and tanning bed exposure while it is healing and for several weeks after healing.  °Only take over-the-counter or prescription medicines as directed by your health care provider.  °Keep all follow-up appointments as directed by your health care provider. °SEEK IMMEDIATE MEDICAL CARE IF:  °Your wound becomes red, swollen, hot, or tender.  °You develop a rash after the glue is applied.  °You have increasing pain in the wound.  °You have a red streak that goes away from the wound.  °You have pus coming from the wound.  °You have increased bleeding.  °You have a fever.  °You have shaking chills.  °You notice a bad smell coming from the wound.  °Your wound or adhesive breaks open.  °MAKE SURE YOU:  °Understand these instructions.  °Will watch your condition.  °Will get help right away if you are not doing well or get worse. °This information is not  intended to replace advice given to you by your health care provider. Make sure you discuss any questions you have with your health care provider.  °Document Released: 12/11/2000 Document Revised: 04/07/2013 Document Reviewed: 01/06/2013  °Elsevier Interactive Patient Education ©2016 [employer]  ° °

## 2015-08-02 NOTE — ED Provider Notes (Signed)
CSN: [id number]     Arrival date & time 08/02/15  0900 History   First MD Initiated Contact with Patient 08/02/15 [id number]     Chief Complaint  Patient presents with  . Finger Lacerations      (Consider location/radiation/quality/duration/timing/severity/associated sxs/prior Treatment) Patient is a 25 y.o. male presenting with skin laceration. The history is provided by the patient.  Laceration Location:  Finger Finger laceration location:  R middle finger Quality: straight   Bleeding: controlled with pressure   Time since incident: just PTA. Laceration mechanism:  Knife Pain details:    Severity:  Moderate   Timing:  Constant   Progression:  Unchanged Foreign body present:  No foreign bodies Relieved by:  None tried Worsened by:  Nothing tried Ineffective treatments:  None tried Tetanus status:  Up to date  Julius Matus is a 25 y.o. male with PMH significant for asthma and acid reflux who presents with laceration to right middle finger sustained just PTA when he was attempting to cut a cable with a razor blade and it slipped, cutting his finger.  Past Medical History  Diagnosis Date  . Asthma   . Acid reflux    Past Surgical History  Procedure Laterality Date  . Foot surgery     History reviewed. No pertinent family history. Social History  Substance Use Topics  . Smoking status: Light Tobacco Smoker    Types: Cigarettes  . Smokeless tobacco: Never Used  . Alcohol Use: No    Review of Systems  Constitutional: Negative for fever.  Skin: Positive for wound.  Neurological: Negative for weakness and numbness.  All other systems reviewed and are negative.     Allergies  Vicodin  Home Medications   Prior to Admission medications   Medication Sig Start Date End Date Taking? Authorizing Provider  cloNIDine (CATAPRES) 0.1 MG tablet Take one tablet every 3 hours (8 doses) on day ONE. Take one tablet every 6 hours (4 doses) on day TWO. Take one tablet every 12  hours (2 doses) on day THREE. 07/31/14   Arthor Captain, PA-C  cyclobenzaprine (FLEXERIL) 10 MG tablet Take 1 tablet (10 mg total) by mouth 3 (three) times daily as needed for muscle spasms. 07/31/14   Arthor Captain, PA-C  dicyclomine (BENTYL) 20 MG tablet Take 1 tablet (20 mg total) by mouth every 6 (six) hours. For abdominal cramping 07/31/14   Arthor Captain, PA-C  diphenhydrAMINE (BENADRYL) 25 MG tablet Take 1 tablet (25 mg total) by mouth every 6 (six) hours as needed. 07/31/14   Arthor Captain, PA-C  ibuprofen (ADVIL,MOTRIN) 800 MG tablet Take 1 tablet (800 mg total) by mouth 3 (three) times daily. Patient not taking: Reported on 07/31/2014 09/14/13   Felicie Morn, NP  loperamide (IMODIUM) 2 MG capsule Take 1-2 capsules (2-4 mg total) by mouth once. FOR DIARRHEA 07/31/14   Arthor Captain, PA-C  naproxen (NAPROSYN) 500 MG tablet Take 1 tablet (500 mg total) by mouth 2 (two) times daily. FOR BODY ACHES 07/31/14   Arthor Captain, PA-C  naproxen sodium (ANAPROX) 220 MG tablet Take 440 mg by mouth 2 (two) times daily as needed (pain).    Historical Provider, MD  ondansetron (ZOFRAN) 4 MG tablet Take 1 tablet (4 mg total) by mouth every 8 (eight) hours as needed for nausea or vomiting. 07/31/14   Arthor Captain, PA-C  oxyCODONE-acetaminophen (PERCOCET/ROXICET) 5-325 MG per tablet Take 1 tablet by mouth every 8 (eight) hours as needed for severe pain. Patient not taking: Reported  on 07/31/2014 09/14/13   Felicie Morn, NP  penicillin v potassium (VEETID) 500 MG tablet Take 1 tablet (500 mg total) by mouth 3 (three) times daily. Patient not taking: Reported on 07/31/2014 09/14/13   Felicie Morn, NP   BP 138/81 mmHg  Pulse 101  Temp(Src) 97.8 F (36.6 C) (Oral)  Resp 16  SpO2 96% Physical Exam  Constitutional: He is oriented to person, place, and time. He appears well-developed and well-nourished.  HENT:  Head: Atraumatic.  Eyes: Conjunctivae are normal. No scleral icterus.  Neck: No tracheal deviation  present.  Cardiovascular:  Capillary refill less than 3 seconds distal to injury.   Pulmonary/Chest: Effort normal. No respiratory distress.  Abdominal: He exhibits no distension.  Musculoskeletal: Normal range of motion.  FAROM of all fingers of the right hand.   Neurological: He is alert and oriented to person, place, and time.  Strength and sensation intact distal to injury.   Skin: Skin is warm and dry. Laceration noted.  1 cm superficial linear laceration to volar aspect of right middle finger in between PIPJ and DIPJ.  No visualization or palpation of foreign bodies within a bloodless field.  Bleeding controlled and stopped with pressure.   Psychiatric: He has a normal mood and affect. His behavior is normal.    ED Course  Procedures (including critical care time) Labs Review Labs Reviewed - No data to display  Imaging Review No results found. I have personally reviewed and evaluated these images and lab results as part of my medical decision-making.   EKG Interpretation None      MDM   Final diagnoses:  Laceration of right middle finger w/o foreign body w/o damage to nail, initial encounter    Patient presents with superficial laceration.  Tdap up to date.  No visualization or palpation of FB.  No indication for imaging at this time given mechanism.  NVI distal to injury.  I recommended suture repair to ensure wound closure.  Patient refused and requested "the glue".  I counseled the patient on wound repair, and patient continually deferred suture repair.  Wound cleaned and repaired with dermabond. Follow up PCP. Discussed return precautions.  Patient agrees and acknowledges the above plan for discharge.    Cheri Fowler, PA-C 08/02/15 [id number]  Courteney Randall An, MD 08/05/15 [id number]
